# Patient Record
Sex: Male | Born: 1970 | Race: Black or African American | Hispanic: No | Smoking: Current every day smoker
Health system: Southern US, Community
[De-identification: ages and names within clinical notes are randomized; demographics above are authoritative.]

---

## 2009-07-04 DIAGNOSIS — B20 Human immunodeficiency virus [HIV] disease: Secondary | ICD-10-CM

## 2009-08-22 ENCOUNTER — Telehealth: Payer: Self-pay

## 2010-02-24 NOTE — Progress Notes (Signed)
Summary: [facility] manager Call   Call placed by: Tomasita Morrow RN,  August 22, 2009 12:15 PM Summary of Call: [name] show for 08-13-09 intake. [place] [facility] referral . [facility] Ref made. Tomasita Morrow RN  August 22, 2009 12:17 PM

## 2010-02-24 NOTE — Miscellaneous (Signed)
Summary: Lab orders/New Intake   Clinical Lists Changes  Problems: Added new problem of HIV INFECTION (ICD-042) Orders: Added new Test order of T-Comprehensive Metabolic Panel [phone]) - Signed Added new Test order of T-CBC w/Diff [phone]) - Signed Added new Test order of T-CD4SP [facility]) (CD4SP) - Signed Added new Test order of T-Chlamydia  Probe, urine [phone]) - Signed Added new Test order of T-GC Probe, urine [phone]) - Signed Added new Test order of T-Hepatitis B Surface Antigen [phone]) - Signed Added new Test order of T-Hepatitis B Surface Antibody [phone]) - Signed Added new Test order of T-Hepatitis B Core Antibody ([id number]) - Signed Added new Test order of T-Hepatitis C Antibody ([id number]) - Signed Added new Test order of T-HIV Antibody  (Reflex) [phone]) - Signed Added new Test order of T-HIV Ab Confirmatory Test/Western Blot ([id number]) - Signed Added new Test order of T-Lipid Profile ([id number]) - Signed Added new Test order of T-RPR (Syphilis) [phone]) - Signed Added new Test order of T-Urinalysis ([id number]) - Signed Added new Test order of T-Hepatitis A Antibody ([id number]) - Signed Added new Test order of T-HIV1 Quant rflx Ultra or Genotype ([id number]) - Signed

## 2010-09-04 ENCOUNTER — Emergency Department: Payer: Self-pay | Admitting: Emergency Medicine

## 2011-03-28 ENCOUNTER — Emergency Department: Payer: Self-pay | Admitting: Emergency Medicine

## 2011-03-31 ENCOUNTER — Emergency Department: Payer: Self-pay | Admitting: Emergency Medicine

## 2011-12-26 ENCOUNTER — Ambulatory Visit: Payer: Self-pay | Admitting: Hematology and Oncology

## 2012-01-19 ENCOUNTER — Emergency Department: Payer: Self-pay | Admitting: Emergency Medicine

## 2012-01-19 LAB — DIFFERENTIAL
Basophil %: 0.6 %
Eosinophil %: 0.5 %
Lymphocyte %: 9.4 %
Monocyte #: 1.3 x10 3/mm — ABNORMAL HIGH (ref 0.2–1.0)
Monocyte %: 14.6 %
Neutrophil %: 74.9 %

## 2012-01-19 LAB — CBC
HCT: 43.9 % (ref 40.0–52.0)
MCH: 29.7 pg (ref 26.0–34.0)
MCHC: 35.3 g/dL (ref 32.0–36.0)
MCV: 84 fL (ref 80–100)
WBC: 8.8 10*3/uL (ref 3.8–10.6)

## 2012-01-19 LAB — COMPREHENSIVE METABOLIC PANEL
Albumin: 3.3 g/dL — ABNORMAL LOW (ref 3.4–5.0)
Alkaline Phosphatase: 88 U/L (ref 50–136)
Anion Gap: 11 (ref 7–16)
BUN: 15 mg/dL (ref 7–18)
Calcium, Total: 8.6 mg/dL (ref 8.5–10.1)
Co2: 22 mmol/L (ref 21–32)
EGFR (Non-African Amer.): >60
SGOT(AST): 55 U/L — ABNORMAL HIGH (ref 15–37)
SGPT (ALT): 58 U/L (ref 12–78)
Total Protein: 9.7 g/dL — ABNORMAL HIGH (ref 6.4–8.2)

## 2012-01-19 LAB — URINALYSIS, COMPLETE
Nitrite: NEGATIVE
Ph: 5 (ref 4.5–8.0)
Protein: 100
RBC,UR: 16 /HPF (ref 0–5)
Specific Gravity: 1.02 (ref 1.003–1.030)
Squamous Epithelial: 1
WBC UR: 85 /HPF (ref 0–5)

## 2012-01-20 ENCOUNTER — Ambulatory Visit: Payer: Self-pay | Admitting: Hematology and Oncology

## 2012-01-20 LAB — URINE CULTURE

## 2013-07-20 ENCOUNTER — Emergency Department: Payer: Self-pay | Admitting: Emergency Medicine
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
## 2018-11-05 ENCOUNTER — Emergency Department
Admission: EM | Admit: 2018-11-05 | Discharge: 2018-11-05 | Disposition: A | Discharge status: Home / Self Care | Payer: Self-pay | Attending: Emergency Medicine | Admitting: Emergency Medicine

## 2018-11-05 ENCOUNTER — Encounter: Payer: Self-pay | Admitting: Emergency Medicine

## 2018-11-05 ENCOUNTER — Other Ambulatory Visit: Payer: Self-pay

## 2018-11-05 DIAGNOSIS — Z21 Asymptomatic human immunodeficiency virus [HIV] infection status: Secondary | ICD-10-CM | POA: Insufficient documentation

## 2018-11-05 DIAGNOSIS — K047 Periapical abscess without sinus: Secondary | ICD-10-CM | POA: Insufficient documentation

## 2018-11-05 DIAGNOSIS — F17210 Nicotine dependence, cigarettes, uncomplicated: Secondary | ICD-10-CM | POA: Insufficient documentation

## 2018-11-05 MED ORDER — CEFTRIAXONE SODIUM 1 G IJ SOLR
1.0000 g | Freq: Once | INTRAMUSCULAR | Status: AC
  Administered 2018-11-05: 1 g via INTRAMUSCULAR
  Filled 2018-11-05: qty 10

## 2018-11-05 MED ORDER — AMOXICILLIN 500 MG PO CAPS: 500.0000 mg | ORAL_CAPSULE | Freq: Three times a day (TID) | ORAL | 0 refills | Status: AC

## 2018-11-05 NOTE — Discharge Instructions (Signed)
Follow-up with a dentist of your choice or look at the list of dentists listed on your discharge papers.  Begin taking the antibiotic until completely finished.  You may take Tylenol or ibuprofen as needed for pain.  OPTIONS FOR DENTAL FOLLOW UP CARE  [place] [facility] - Local Safety Net Dental Clinics [email or web address]   [facility] [phone])  Sharl Ma [phone])  [place] [phone])  [facility] [phone])  [facility] [phone]) This clinic caters to the indigent population and is on a lottery system. Location: [employer] [facility]istry, [employer], [address] [place] Clinic Hours: Wednesdays from 6pm - 9pm, patients seen by a lottery system. For dates, call or go to [email or web address] Services: Cleanings, fillings and simple extractions. Payment Options: DENTAL WORK IS FREE OF CHARGE. Bring proof of income or support. Best way to get seen: Arrive at 5:15 pm - this is a lottery, NOT first come/first serve, so arriving earlier will not increase your chances of being seen.     [facility] [phone] Select option 1 for emergencies   Location: [facility], [place], [address] [place] Clinic Hours: No walk-ins accepted - call the day before to schedule an appointment. Check in times are 9:30 am and 1:30 pm. Services: Simple extractions, temporary fillings, pulpectomy/pulp debridement, uncomplicated abscess drainage. Payment Options: PAYMENT IS DUE AT THE TIME OF SERVICE.  Fee is usually $100-200, additional surgical procedures (e.g. abscess drainage) may be extra. Cash, checks, Visa/MasterCard accepted.  Can file Medicaid if patient is covered for dental - patient should call case worker to check. No discount for [facility] patients. Best way to get seen: MUST call the day before and get onto the schedule. Can usually be seen the next 1-2 days. No walk-ins accepted.     [facility] [phone]   Location: [facility], [address] [place] Clinic Hours: M, W, Th, F 8am or 1:30pm, Tues 9a or 1:30 - first come/first served. Services: Simple extractions, temporary fillings, uncomplicated abscess drainage.  You do not need to be an [facility] resident. Payment Options: PAYMENT IS DUE AT THE TIME OF SERVICE. Dental insurance, otherwise sliding scale - bring proof of income or support. Depending on income and treatment needed, cost is usually $50-200. Best way to get seen: Arrive early as it is first come/first served.     [facility] [phone]   Location: [address] Clinic Hours: Mon-Thu 8a-5p Services: Most basic dental services including extractions and fillings. Payment Options: PAYMENT IS DUE AT THE TIME OF SERVICE. Sliding scale, up to 50% off - bring proof if income or support. Medicaid with dental option accepted. Best way to get seen: Call to schedule an appointment, can usually be seen within 2 weeks OR they will try to see walk-ins - show up at 8a or 2p (you may have to wait).     [facility] [phone] [place] RESIDENTS ONLY   Location: [facility], [address], [place], [place] [address] Clinic Hours: By appointment only. Monday - Thursday 8am-5pm, Friday 8am-12pm Services: Cleanings, fillings, extractions. Payment Options: PAYMENT IS DUE AT THE TIME OF SERVICE. Cash, Visa or MasterCard. Sliding scale - $30 minimum per service. Best way to get seen: Come in to office, complete packet and make an appointment - need proof  of income or support monies for each household member and proof of [facility] residence. Usually takes  about a month to get in.     [facility] [phone]   Location: [address] [place] Clinic Hours: Walk-in Urgent Care Dental Services are offered Monday-Friday mornings only. The numbers of emergencies accepted daily is limited to the number of providers available. Maximum 15 - Mondays, Wednesdays & Thursdays Maximum 10 - Tuesdays & Fridays Services: You do not need to be a [facility] resident to be seen for a dental emergency. Emergencies are defined as pain, swelling, abnormal bleeding, or dental trauma. Walkins will receive x-rays if needed. NOTE: Dental cleaning is not an emergency. Payment Options: PAYMENT IS DUE AT THE TIME OF SERVICE. Minimum co-pay is $40.00 for uninsured patients. Minimum co-pay is $3.00 for Medicaid with dental coverage. Dental Insurance is accepted and must be presented at time of visit. Medicare does not cover dental. Forms of payment: Cash, credit card, checks. Best way to get seen: If not previously registered with the clinic, walk-in dental registration begins at 7:15 am and is on a first come/first serve basis. If previously registered with the clinic, call to make an appointment.     [facility] [place] ONLY   Location: [address], [place], [place] Clinic Hours: Mon-Thu 10a-2p Services: Extractions only! Payment Options: FREE (donations accepted) - bring proof of income or support Best way to get seen: Call and schedule an appointment OR come at 8am on the 1st Monday of every month (except for holidays) when it is first come/first served.     [facility] [phone]   Location: [place], [place] Clinic Hours: Friday mornings Services, Payment Options, Best way to get seen: Call for info

## 2018-11-05 NOTE — ED Triage Notes (Signed)
Left lower jaw pain and swelling x 2 days.

## 2018-11-05 NOTE — ED Provider Notes (Signed)
[facility] Emergency Department Provider Note   ____________________________________________   First MD Initiated Contact with Patient 11/05/18 1141     (approximate)  I have reviewed the triage vital signs and the nursing notes.   HISTORY  Chief Complaint Dental Pain   HPI Phillip Mckinney is a 48 y.o. male presents to the ED with complaint of dental pain for the last 2 days.  Patient states that last evening his left lower jaw began to swell.  He has been aware that he chipped a tooth for some time but he has not given him any problems until now.  He rates his pain as a 9/10.      History reviewed. No pertinent past medical history.  Patient Active Problem List   Diagnosis Date Noted  . HIV INFECTION 07/04/2009    History reviewed. No pertinent surgical history.  Prior to Admission medications   Medication Sig Start Date End Date Taking? Authorizing Provider  amoxicillin (AMOXIL) 500 MG capsule Take 1 capsule (500 mg total) by mouth 3 (three) times daily. 11/05/18   Tommi Rumps, PA-C    Allergies Patient has no known allergies.  No family history on file.  Social History Social History   Tobacco Use  . Smoking status: Current Every Day Smoker    Types: Cigarettes  . Smokeless tobacco: Never Used  Substance Use Topics  . Alcohol use: Not on file  . Drug use: Not on file    Review of Systems Constitutional: No fever/chills Eyes: No visual changes. ENT: Positive for dental pain. Cardiovascular: Denies chest pain. Respiratory: Denies shortness of breath. Gastrointestinal: No abdominal pain.  No nausea, no vomiting.   Musculoskeletal: Negative for back pain. Skin: Negative for rash. Neurological: Negative for headaches, focal weakness or numbness. ___________________________________________   PHYSICAL EXAM:  VITAL SIGNS: ED Triage Vitals  Enc Vitals Group     BP 11/05/18 1134 120/70     Pulse Rate 11/05/18 1134  97     Resp 11/05/18 1134 20     Temp 11/05/18 1134 (!) 100.4 F (38 C)     Temp Source 11/05/18 1134 Oral     SpO2 11/05/18 1134 96 %     Weight 11/05/18 1128 200 lb (90.7 kg)     Height 11/05/18 1134 5\' 9"  (1.753 m)     Head Circumference --      Peak Flow --      Pain Score 11/05/18 1128 9     Pain Loc --      Pain Edu? --      Excl. in [place]? --     Constitutional: Alert and oriented. Well appearing and in no acute distress. Eyes: Conjunctivae are normal.  Head: Atraumatic. Mouth/Throat: Mucous membranes are moist.  Oropharynx non-erythematous.  Left lower molar with moderate soft tissue edema present.  Area is tender to palpation.  No active drainage is noted.  Tooth is in very poor repair. Neck: No stridor.  No cervical lymphadenopathy noted. Cardiovascular: Normal rate, regular rhythm. Grossly normal heart sounds.  Good peripheral circulation. Respiratory: Normal respiratory effort.  No retractions. Lungs CTAB. Musculoskeletal: Moves upper and lower extremities no difficulty.  Normal gait was noted. Neurologic:  Normal speech and language. No gross focal neurologic deficits are appreciated.  Skin:  Skin is warm, dry and intact. No rash noted. Psychiatric: Mood and affect are normal. Speech and behavior are normal.  ____________________________________________   LABS (all labs ordered are listed, but  only abnormal results are displayed)  Labs Reviewed - No data to display ____________________________________________   RADIOLOGY  ED MD interpretation:    Official radiology report(s): No results found.  ____________________________________________   PROCEDURES  Procedure(s) performed (including Critical Care):  Procedures  ____________________________________________   INITIAL IMPRESSION / ASSESSMENT AND PLAN / ED COURSE  As part of my medical decision making, I reviewed the following data within the electronic MEDICAL RECORD NUMBER Notes from prior ED visits and   Controlled Substance [place] was evaluated in Emergency Department on 11/05/2018 for the symptoms described in the history of present illness. He was evaluated in the context of the global COVID-19 pandemic, which necessitated consideration that the patient might be at risk for infection with the SARS-CoV-2 virus that causes COVID-19. Institutional protocols and algorithms that pertain to the evaluation of patients at risk for COVID-19 are in a state of rapid change based on information released by regulatory bodies including the CDC and federal and state organizations. These policies and algorithms were followed during the patient's care in the ED.  48 year old male presents to the ED with complaint of left lower dental pain and swelling times last 2 days.  Patient is unaware of any fever and actually had a low-grade temp while in the ED.  He denies any COVID symptoms.  Patient was given Rocephin 1 g IM while in the ED.  He was also placed on amoxicillin 500 mg 3 times daily for 10 days.  He is aware that he should return to the emergency department if any severe worsening of his symptoms or inability to take antibiotics.  Should he continue to run fever he is urged to return for further evaluation.    ____________________________________________   FINAL CLINICAL IMPRESSION(S) / ED DIAGNOSES  Final diagnoses:  Dental abscess     ED Discharge Orders         Ordered    amoxicillin (AMOXIL) 500 MG capsule  3 times daily     11/05/18 1222           Note:  This document was prepared using Dragon voice recognition software and may include unintentional dictation errors.    Johnn Hai, PA-C 11/05/18 1526    Nance Pear, MD 11/05/18 [phone]

## 2018-12-27 ENCOUNTER — Emergency Department
Admission: EM | Admit: 2018-12-27 | Discharge: 2018-12-27 | Disposition: A | Discharge status: Home / Self Care | Payer: Self-pay | Attending: Emergency Medicine | Admitting: Emergency Medicine

## 2018-12-27 ENCOUNTER — Other Ambulatory Visit: Payer: Self-pay

## 2018-12-27 ENCOUNTER — Encounter: Payer: Self-pay | Admitting: Emergency Medicine

## 2018-12-27 DIAGNOSIS — L03116 Cellulitis of left lower limb: Secondary | ICD-10-CM | POA: Insufficient documentation

## 2018-12-27 DIAGNOSIS — B20 Human immunodeficiency virus [HIV] disease: Secondary | ICD-10-CM | POA: Insufficient documentation

## 2018-12-27 DIAGNOSIS — M79662 Pain in left lower leg: Secondary | ICD-10-CM | POA: Insufficient documentation

## 2018-12-27 DIAGNOSIS — F17210 Nicotine dependence, cigarettes, uncomplicated: Secondary | ICD-10-CM | POA: Insufficient documentation

## 2018-12-27 MED ORDER — TRAMADOL HCL 50 MG PO TABS
50.0000 mg | ORAL_TABLET | Freq: Once | ORAL | Status: AC
  Administered 2018-12-27: 12:00:00 50 mg via ORAL
  Filled 2018-12-27: qty 1

## 2018-12-27 MED ORDER — IBUPROFEN 600 MG PO TABS
600.0000 mg | ORAL_TABLET | Freq: Once | ORAL | Status: AC
  Administered 2018-12-27: 600 mg via ORAL
  Filled 2018-12-27: qty 1

## 2018-12-27 MED ORDER — NAPROXEN 500 MG PO TABS: 500.0000 mg | ORAL_TABLET | Freq: Two times a day (BID) | ORAL | Status: AC

## 2018-12-27 MED ORDER — CEPHALEXIN 500 MG PO CAPS: 500.0000 mg | ORAL_CAPSULE | Freq: Four times a day (QID) | ORAL | 0 refills | Status: AC

## 2018-12-27 MED ORDER — TRAMADOL HCL 50 MG PO TABS: 50.0000 mg | ORAL_TABLET | Freq: Four times a day (QID) | ORAL | 0 refills | Status: AC | PRN

## 2018-12-27 NOTE — Discharge Instructions (Signed)
Follow discharge care instruction take medication as directed. °

## 2018-12-27 NOTE — ED Triage Notes (Signed)
Pt in via POV, ambulatory to triage, complaints of left lower leg pain x approximately 4 days.  Some swelling noted above ankle, color WDL.  NAD noted at this time.

## 2018-12-27 NOTE — ED Provider Notes (Signed)
[facility] Emergency Department Provider Note   ____________________________________________   First MD Initiated Contact with Patient 12/27/18 1133     (approximate)  I have reviewed the triage vital signs and the nursing notes.   HISTORY  Chief Complaint Leg Pain    HPI Phillip Mckinney is a 48 y.o. male patient presents with pain, swelling, and redness to the left lower leg for 4 days.  Patient states had an abrasion to the leg earlier this week.  Patient is immune compromised secondary to HIV.  Patient rates his pain as a 10/10.  Patient described the pain as "sore".  No palliative measure for complaint.         History reviewed. No pertinent past medical history.  Patient Active Problem List   Diagnosis Date Noted  . HIV INFECTION 07/04/2009    History reviewed. No pertinent surgical history.  Prior to Admission medications   Medication Sig Start Date End Date Taking? Authorizing Provider  amoxicillin (AMOXIL) 500 MG capsule Take 1 capsule (500 mg total) by mouth 3 (three) times daily. 11/05/18   Johnn Hai, PA-C  cephALEXin (KEFLEX) 500 MG capsule Take 1 capsule (500 mg total) by mouth 4 (four) times daily for 10 days. 12/27/18 01/06/19  Sable Feil, PA-C  naproxen (NAPROSYN) 500 MG tablet Take 1 tablet (500 mg total) by mouth 2 (two) times daily with a meal. 12/27/18   Sable Feil, PA-C  traMADol (ULTRAM) 50 MG tablet Take 1 tablet (50 mg total) by mouth every 6 (six) hours as needed for up to 3 days. 12/27/18 12/30/18  Sable Feil, PA-C    Allergies Patient has no known allergies.  No family history on file.  Social History Social History   Tobacco Use  . Smoking status: Current Every Day Smoker    Types: Cigarettes  . Smokeless tobacco: Never Used  Substance Use Topics  . Alcohol use: Not on file  . Drug use: Not on file    Review of Systems Constitutional: No fever/chills Eyes: No visual changes. ENT:  No sore throat. Cardiovascular: Denies chest pain. Respiratory: Denies shortness of breath. Gastrointestinal: No abdominal pain.  No nausea, no vomiting.  No diarrhea.  No constipation. Genitourinary: Negative for dysuria. Musculoskeletal: Negative for back pain. Skin: Negative for rash.  Edema erythema left lower leg. Neurological: Negative for headaches, focal weakness or numbness. Allergic/Immunilogical:  HIV.  ____________________________________________   PHYSICAL EXAM:  VITAL SIGNS: ED Triage Vitals  Enc Vitals Group     BP 12/27/18 1123 128/78     Pulse Rate 12/27/18 1123 92     Resp 12/27/18 1123 16     Temp 12/27/18 1123 98.4 F (36.9 C)     Temp Source 12/27/18 1123 Oral     SpO2 12/27/18 1123 98 %     Weight 12/27/18 1114 201 lb (91.2 kg)     Height 12/27/18 1114 5\' 9"  (1.753 m)     Head Circumference --      Peak Flow --      Pain Score 12/27/18 1113 10     Pain Loc --      Pain Edu? --      Excl. in [place]? --    Constitutional: Alert and oriented. Well appearing and in no acute distress. Cardiovascular: Normal rate, regular rhythm. Grossly normal heart sounds.  Good peripheral circulation. Respiratory: Normal respiratory effort.  No retractions. Lungs CTAB. Musculoskeletal: Left lower leg edema.  Neurologic:  Normal speech and language. No gross focal neurologic deficits are appreciated. No gait instability. Skin:  Skin is warm, dry and intact. No rash noted.  Edema and erythema left lower leg. Psychiatric: Mood and affect are normal. Speech and behavior are normal.  ____________________________________________   LABS (all labs ordered are listed, but only abnormal results are displayed)  Labs Reviewed - No data to display ____________________________________________  EKG   ____________________________________________  RADIOLOGY  ED MD interpretation:    Official radiology report(s): No results found.   ____________________________________________   PROCEDURES  Procedure(s) performed (including Critical Care):  Procedures   ____________________________________________   INITIAL IMPRESSION / ASSESSMENT AND PLAN / ED COURSE  As part of my medical decision making, I reviewed the following data within the electronic MEDICAL RECORD NUMBER     Patient presents with 4 days of redness and swelling to the left lower leg.  Pain since then occurred after an abrasion to the leg.  Patient is immunocompromised secondary to HIV.  Patient physical exam is consistent with cellulitis.  Patient given discharge care instruction work note.  Take medication as directed.  Patient advised [facility] with [facility].    Phillip Mckinney was evaluated in Emergency Department on 12/27/2018 for the symptoms described in the history of present illness. He was evaluated in the context of the global COVID-19 pandemic, which necessitated consideration that the patient might be at risk for infection with the SARS-CoV-2 virus that causes COVID-19. Institutional protocols and algorithms that pertain to the evaluation of patients at risk for COVID-19 are in a state of rapid change based on information released by regulatory bodies including the CDC and federal and state organizations. These policies and algorithms were followed during the patient's care in the ED.       ____________________________________________   FINAL CLINICAL IMPRESSION(S) / ED DIAGNOSES  Final diagnoses:  Cellulitis of left lower extremity     ED Discharge Orders         Ordered    cephALEXin (KEFLEX) 500 MG capsule  4 times daily     12/27/18 1204    naproxen (NAPROSYN) 500 MG tablet  2 times daily with meals     12/27/18 1204    traMADol (ULTRAM) 50 MG tablet  Every 6 hours PRN     12/27/18 1204           Note:  This document was prepared using Dragon voice recognition software and may include unintentional  dictation errors.    Joni Reining, PA-C 12/27/18 1209    Emily Filbert, MD 12/27/18 1249

## 2019-08-07 ENCOUNTER — Telehealth: Payer: Self-pay | Admitting: General Practice

## 2019-08-07 NOTE — Telephone Encounter (Signed)
Individual has been contacted 3+ times regarding ED referral and has been given information regarding the clinic. No further attempts to contact individual will be made. 

## 2019-10-22 ENCOUNTER — Emergency Department
Admission: EM | Admit: 2019-10-22 | Discharge: 2019-10-22 | Disposition: A | Discharge status: Home / Self Care | Payer: Self-pay | Attending: Emergency Medicine | Admitting: Emergency Medicine

## 2019-10-22 ENCOUNTER — Other Ambulatory Visit: Payer: Self-pay

## 2019-10-22 DIAGNOSIS — K047 Periapical abscess without sinus: Secondary | ICD-10-CM | POA: Insufficient documentation

## 2019-10-22 DIAGNOSIS — F17210 Nicotine dependence, cigarettes, uncomplicated: Secondary | ICD-10-CM | POA: Insufficient documentation

## 2019-10-22 MED ORDER — CLINDAMYCIN HCL 150 MG PO CAPS
300.0000 mg | ORAL_CAPSULE | ORAL | Status: AC
  Administered 2019-10-22: 300 mg via ORAL
  Filled 2019-10-22: qty 2

## 2019-10-22 MED ORDER — IBUPROFEN 800 MG PO TABS
800.0000 mg | ORAL_TABLET | ORAL | Status: AC
  Administered 2019-10-22: 800 mg via ORAL
  Filled 2019-10-22: qty 1

## 2019-10-22 MED ORDER — HYDROCODONE-ACETAMINOPHEN 5-325 MG PO TABS: 1.0000 | ORAL_TABLET | Freq: Four times a day (QID) | ORAL | 0 refills | Status: AC | PRN

## 2019-10-22 MED ORDER — HYDROCODONE-ACETAMINOPHEN 5-325 MG PO TABS
2.0000 | ORAL_TABLET | ORAL | Status: AC
  Administered 2019-10-22: 2 via ORAL
  Filled 2019-10-22: qty 2

## 2019-10-22 MED ORDER — CLINDAMYCIN HCL 300 MG PO CAPS: 300.0000 mg | ORAL_CAPSULE | Freq: Three times a day (TID) | ORAL | 0 refills | Status: AC

## 2019-10-22 NOTE — Discharge Instructions (Addendum)
You have been seen in the Emergency Department (ED) today for dental pain.  Please take your prescribed antibiotic.  You may take pain medication as needed but ONLY as prescribed.  You should also take over-the-counter pain medication such as ibuprofen according to the label instructions unless a doctor has previously told you to avoid this type of medication (due to stomach ulcers, for example).  Please see you dentist as soon as possible; only a dentist will be able to fix your problem(s).  Please see below for dental follow up options.  Return to the ED if you develop worsening pain, fever, pus/drainage, difficulty breathing, or other symptoms that concern you.  OPTIONS FOR DENTAL FOLLOW UP CARE  [place] Department of Health and Human Services - Local Safety Net Dental Clinics http://www.ncdhhs.gov/dph/oralhealth/services/safetynetclinics.htm   [facility] ([phone])  [facility] ([phone])  [facility] ([phone])  [facility] ([phone])  [facility] ([phone]) This clinic caters to the indigent population and is on a lottery system. Location: [facility], [address], [place] Clinic Hours: Wednesdays from 6pm - 9pm, patients seen by a lottery system. For dates, call or go to www.med.[facility].edu/[facility]/patients/Dental-[facility] Services: Cleanings, fillings and simple extractions. Payment Options: DENTAL WORK IS FREE OF CHARGE. Bring proof of income or support. Best way to get seen: Arrive at 5:15 pm - this is a lottery, NOT first come/first serve, so arriving earlier will not increase your chances of being seen.     [facility] [phone] Select option 1 for emergencies   Location: [facility], [address], [place] Clinic Hours: No walk-ins accepted - call the day before to schedule an appointment. Check  in times are 9:30 am and 1:30 pm. Services: Simple extractions, temporary fillings, pulpectomy/pulp debridement, uncomplicated abscess drainage. Payment Options: PAYMENT IS DUE AT THE TIME OF SERVICE.  Fee is usually $100-200, additional surgical procedures (e.g. abscess drainage) may be extra. Cash, checks, Visa/MasterCard accepted.  Can file Medicaid if patient is covered for dental - patient should call case worker to check. No discount for [facility] patients. Best way to get seen: MUST call the day before and get onto the schedule. Can usually be seen the next 1-2 days. No walk-ins accepted.     [facility] [phone]   Location: [facility], [address], [place] [facility] Hours: M, W, Th, F 8am or 1:30pm, Tues 9a or 1:30 - first come/first served. Services: Simple extractions, temporary fillings, uncomplicated abscess drainage.  You do not need to be an [place] resident. Payment Options: PAYMENT IS DUE AT THE TIME OF SERVICE. Dental insurance, otherwise sliding scale - bring proof of income or support. Depending on income and treatment needed, cost is usually $50-200. Best way to get seen: Arrive early as it is first come/first served.     [facility] [phone]   Location: [address] Clinic Hours: Mon-Thu 8a-5p Services: Most basic dental services including extractions and fillings. Payment Options: PAYMENT IS DUE AT THE TIME OF SERVICE. Sliding scale, up to 50% off - bring proof if income or support. Medicaid with dental option accepted. Best way to get seen: Call to schedule an appointment, can usually be seen within 2 weeks OR they will try to see walk-ins - show up at 8a or 2p (you may have to wait).       [facility] [phone] [place] RESIDENTS ONLY   Location: [facility], [address], [place], [place]  [address] Clinic Hours: By appointment only. Monday - Thursday 8am-5pm, Friday 8am-12pm Services: Cleanings, fillings, extractions. Payment Options: PAYMENT IS DUE AT THE TIME OF SERVICE. Cash, Visa or MasterCard. Sliding scale - $30 minimum per service. Best way to get seen: Come in to office, complete packet and make an appointment - need proof of income or support monies for each household member and proof of [place] residence. Usually takes about a month to get in.     [facility] [phone]   Location: [address] [place] Clinic Hours: Walk-in Urgent Care Dental Services are offered Monday-Friday mornings only. The numbers of emergencies accepted daily is limited to the number of providers available. Maximum 15 - Mondays, Wednesdays & Thursdays Maximum 10 - Tuesdays & Fridays Services: You do not need to be a [place] resident to be seen for a dental emergency. Emergencies are defined as pain, swelling, abnormal bleeding, or dental trauma. Walkins will receive x-rays if needed. NOTE: Dental cleaning is not an emergency. Payment Options: PAYMENT IS DUE AT THE TIME OF SERVICE. Minimum co-pay is $40.00 for uninsured patients. Minimum co-pay is $3.00 for Medicaid with dental coverage. Dental Insurance is accepted and must be presented at time of visit. Medicare does not cover dental. Forms of payment: Cash, credit card, checks. Best way to get seen: If not previously registered with the clinic, walk-in dental registration begins at 7:15 am and is on a first come/first serve basis. If previously registered with the clinic, call to make an appointment.     [facility] [phone] [place] RESIDENTS ONLY   Location: [address], [place], [place] Clinic Hours: Mon-Thu 10a-2p Services: Extractions only! Payment Options: FREE (donations accepted) - bring proof of income or support Best way to get seen: Call  and schedule an appointment OR come at 8am on the 1st Monday of every month (except for holidays) when it is first come/first served.     [facility] [phone]   Location: [address], [place] Clinic Hours: Friday mornings Services, Payment Options, Best way to get seen: Call for info   

## 2019-10-22 NOTE — ED Provider Notes (Signed)
[facility] Emergency Department Provider Note   ____________________________________________   First MD Initiated Contact with Patient 10/22/19 1713     (approximate)  I have reviewed the triage vital signs and the nursing notes.   HISTORY  Chief Complaint Abscess    HPI Phillip Mckinney is a 49 y.o. male reports no significant past medical history (chart denotes HIV)  History reviewed. No pertinent past medical history.   Noticed yesterday that one of his teeth was causing pain in his left lower jaw.  When he woke up this morning he noticed some swelling over his left jawline.  No trouble swallowing.  No headaches.  No fevers or chills.  Reports this is happened in the past as well  Currently not taking any medication Did take 2 Tylenol tablets last night  No chest pain.  No pain in his neck.  Swelling is over the left jawline  Patient Active Problem List   Diagnosis Date Noted  . HIV INFECTION 07/04/2009    History reviewed. No pertinent surgical history.  Prior to Admission medications   Medication Sig Start Date End Date Taking? Authorizing Provider                                Allergies Patient has no known allergies.  No family history on file.  Social History Social History   Tobacco Use  . Smoking status: Current Every Day Smoker    Types: Cigarettes  . Smokeless tobacco: Never Used  Substance Use Topics  . Alcohol use: Yes    Comment: occ  . Drug use: Never    Review of Systems Constitutional: No fever/chills Eyes: No visual changes. ENT: No sore throat.  See HPI Cardiovascular: Denies chest pain. Respiratory: Denies shortness of breath. Musculoskeletal: Negative for back pain.  No neck pain. Skin: Negative for rash. Neurological: Negative for headaches, areas of focal weakness or numbness.    ____________________________________________   PHYSICAL EXAM:  VITAL SIGNS: ED Triage Vitals [10/22/19  1659]  Enc Vitals Group     BP (!) 158/69     Pulse Rate 79     Resp 18     Temp 98.5 F (36.9 C)     Temp src      SpO2 99 %     Weight 185 lb (83.9 kg)     Height 5\' 9"  (1.753 m)     Head Circumference      Peak Flow      Pain Score 10     Pain Loc      Pain Edu?      Excl. in GC?     Constitutional: Alert and oriented. Well appearing and in no acute distress.  Does appear in some pain, reports it is in his left jaw and tooth Eyes: Conjunctivae are normal. Head: Atraumatic. Nose: No congestion/rhinnorhea. Mouth/Throat: Mucous membranes are moist.  He has mild erythema and some induration along his left lower jawline particularly along the area of his premolars where he has 1 apparently fractured tooth with cavity between that has tenderness.  Patient localizes pain here.  He does have mild swelling of the left lower jawline.  There is no edema or mass notable within the oropharynx in the posterior oropharynx is widely patent.  There is no elevation of the lingula.  Remainder of his teeth are nontender, somewhat poor dentition throughout. Neck: No stridor.  No  anterior neck swelling or tenderness.  No crepitance. Cardiovascular: Normal rate, regular rhythm. Grossly normal heart sounds.  Good peripheral circulation. Respiratory: Normal respiratory effort.  No retractions.  Musculoskeletal: No lower extremity tenderness nor edema. Neurologic:  Normal speech and language. No gross focal neurologic deficits are appreciated.  Skin:  Skin is warm, dry and intact. No rash noted. Psychiatric: Mood and affect are normal. Speech and behavior are normal.  ____________________________________________   LABS (all labs ordered are listed, but only abnormal results are displayed)  Labs Reviewed - No data to  display ____________________________________________  EKG   ____________________________________________  RADIOLOGY   ____________________________________________   PROCEDURES  Procedure(s) performed: None  Procedures  Critical Care performed: No  ____________________________________________   INITIAL IMPRESSION / ASSESSMENT AND PLAN / ED COURSE  Pertinent labs & imaging results that were available during my care of the patient were reviewed by me and considered in my medical decision making (see chart for details).   Clinical examination appears consistent with dental abscess.  There is no physical exam and to suggest just Ludewig's angina.  Does not appear to have acute complication such as swelling or edema that would be compromising his airway.  He is afebrile.  Will trial clindamycin, patient reports that he has a plan to contact a dentist tomorrow.  We will also provide him with dental follow-up recommendations.  Careful return precautions advised.    I will prescribe the patient a narcotic pain medicine due to their condition which I anticipate will cause at least moderate pain short term. I discussed with the patient safe use of narcotic pain medicines, and that they are not to drive, work in dangerous areas, or ever take more than prescribed (no more than 1 pill every 6 hours). We discussed that this is the type of medication that can be  overdosed on and the risks of this type of medicine. Patient is very agreeable to only use as prescribed and to never use more than prescribed.   ____________________________________________   FINAL CLINICAL IMPRESSION(S) / ED DIAGNOSES  Final diagnoses:  Dental abscess        Note:  This document was prepared using Dragon voice recognition software and may include unintentional dictation errors       Sharyn Creamer, MD 10/22/19 1751
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
## 2020-03-14 ENCOUNTER — Emergency Department

## 2020-03-14 ENCOUNTER — Other Ambulatory Visit: Payer: Self-pay

## 2020-03-14 ENCOUNTER — Emergency Department
Admission: EM | Admit: 2020-03-14 | Discharge: 2020-03-14 | Disposition: A | Discharge status: Home / Self Care | Attending: Emergency Medicine | Admitting: Emergency Medicine

## 2020-03-14 DIAGNOSIS — Y9289 Other specified places as the place of occurrence of the external cause: Secondary | ICD-10-CM | POA: Insufficient documentation

## 2020-03-14 DIAGNOSIS — S62512A Displaced fracture of proximal phalanx of left thumb, initial encounter for closed fracture: Secondary | ICD-10-CM | POA: Diagnosis not present

## 2020-03-14 DIAGNOSIS — S0990XA Unspecified injury of head, initial encounter: Secondary | ICD-10-CM | POA: Insufficient documentation

## 2020-03-14 DIAGNOSIS — S60922A Unspecified superficial injury of left hand, initial encounter: Secondary | ICD-10-CM | POA: Diagnosis present

## 2020-03-14 DIAGNOSIS — Z21 Asymptomatic human immunodeficiency virus [HIV] infection status: Secondary | ICD-10-CM | POA: Diagnosis not present

## 2020-03-14 DIAGNOSIS — F17210 Nicotine dependence, cigarettes, uncomplicated: Secondary | ICD-10-CM | POA: Diagnosis not present

## 2020-03-14 MED ORDER — ACETAMINOPHEN 500 MG PO TABS: 1000.0000 mg | ORAL_TABLET | Freq: Four times a day (QID) | ORAL | 0 refills | Status: AC | PRN

## 2020-03-14 MED ORDER — HYDROCODONE-ACETAMINOPHEN 5-325 MG PO TABS
1.0000 | ORAL_TABLET | Freq: Once | ORAL | Status: AC
  Administered 2020-03-14: 1 via ORAL
  Filled 2020-03-14: qty 1

## 2020-03-14 MED ORDER — IBUPROFEN 800 MG PO TABS
800.0000 mg | ORAL_TABLET | Freq: Once | ORAL | Status: AC
  Administered 2020-03-14: 800 mg via ORAL
  Filled 2020-03-14: qty 1

## 2020-03-14 MED ORDER — IBUPROFEN 800 MG PO TABS: 800.0000 mg | ORAL_TABLET | Freq: Three times a day (TID) | ORAL | 0 refills | Status: AC | PRN

## 2020-03-14 NOTE — Discharge Instructions (Signed)
Please keep your splint on at all times.  Please keep it clean and dry.  I recommend you keep your arm elevated above the level of your heart which will help with any pain and swelling.  We have discussed your case with Dr. Rosita Kea with orthopedics.  You are safe to be discharged and orthopedics recommends follow-up in their office in 1 week.  You may alternate Tylenol 1000 mg every 6 hours as needed for pain, fever and Ibuprofen 800 mg every 8 hours as needed for pain, fever.  Please take Ibuprofen with food.  Do not take more than 4000 mg of Tylenol (acetaminophen) in a 24 hour period.
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
## 2022-08-21 IMAGING — CR DG WRIST COMPLETE 3+V*L*
1 series · 4 of 4 positions shown · non-contrast
Comparison: None.

CLINICAL DATA: Left hand and wrist pain.  Swelling.

EXAM:
LEFT WRIST - COMPLETE 3+ VIEW

[Series 1: x wrist pa left · 0.14mm/px · 4 of 4 slices shown]
[im 1/4]
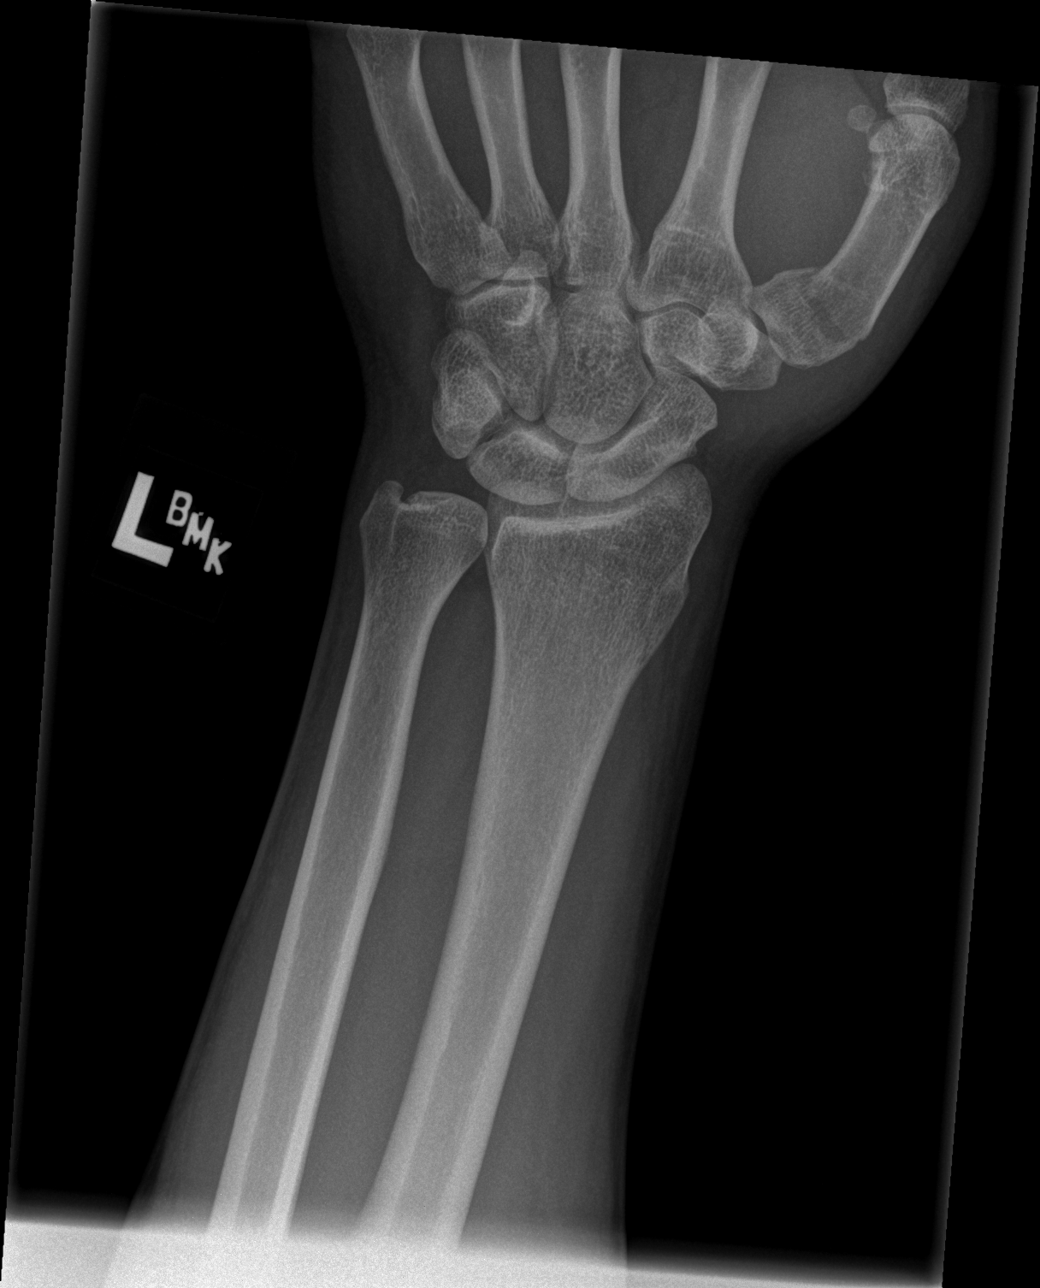
[im 2/4]
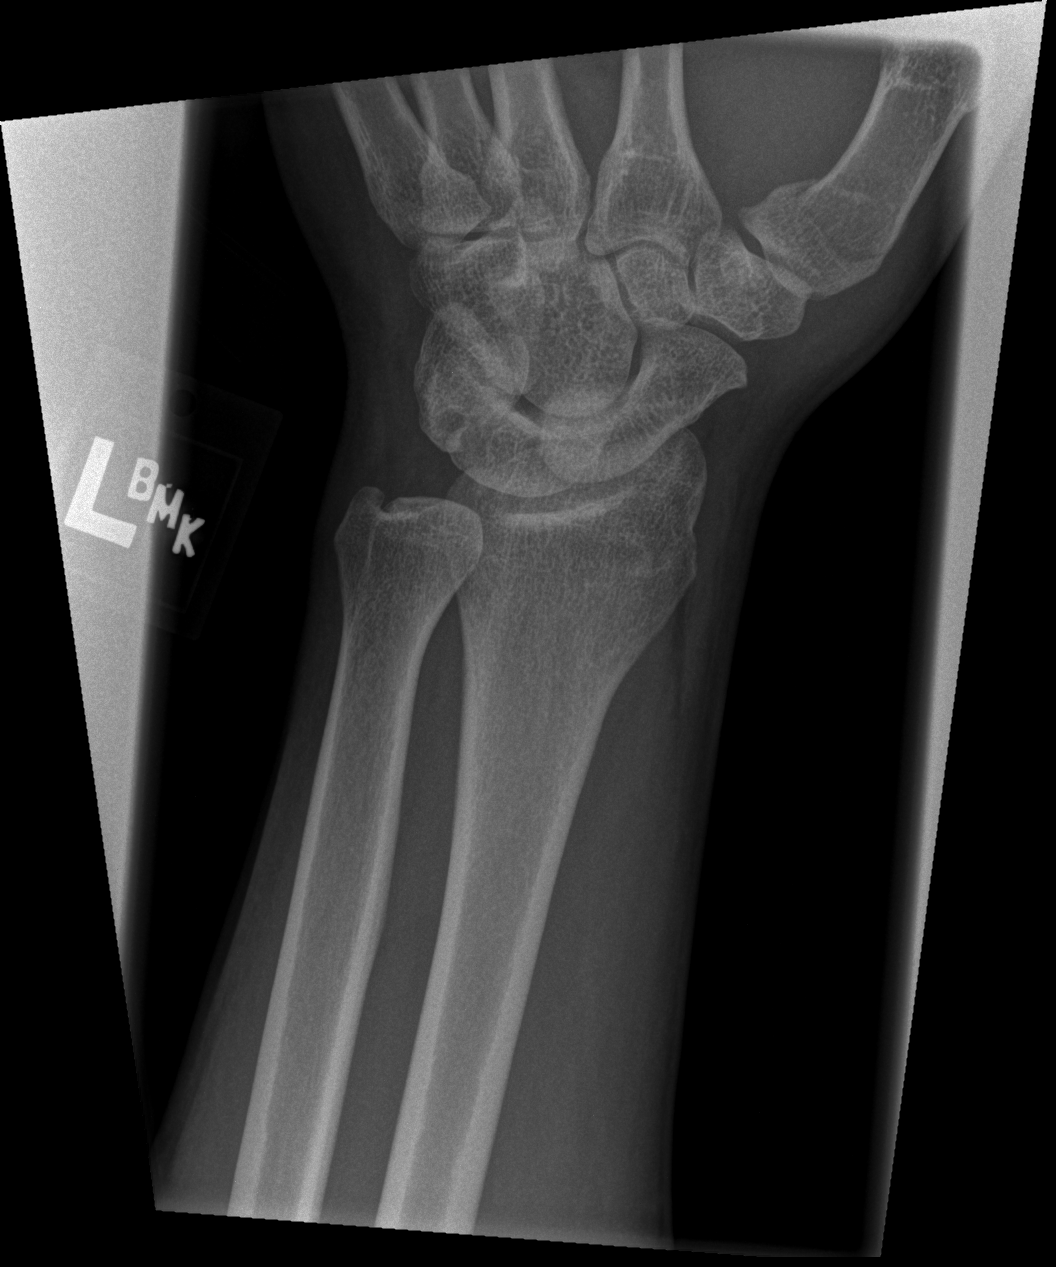
[im 3/4]
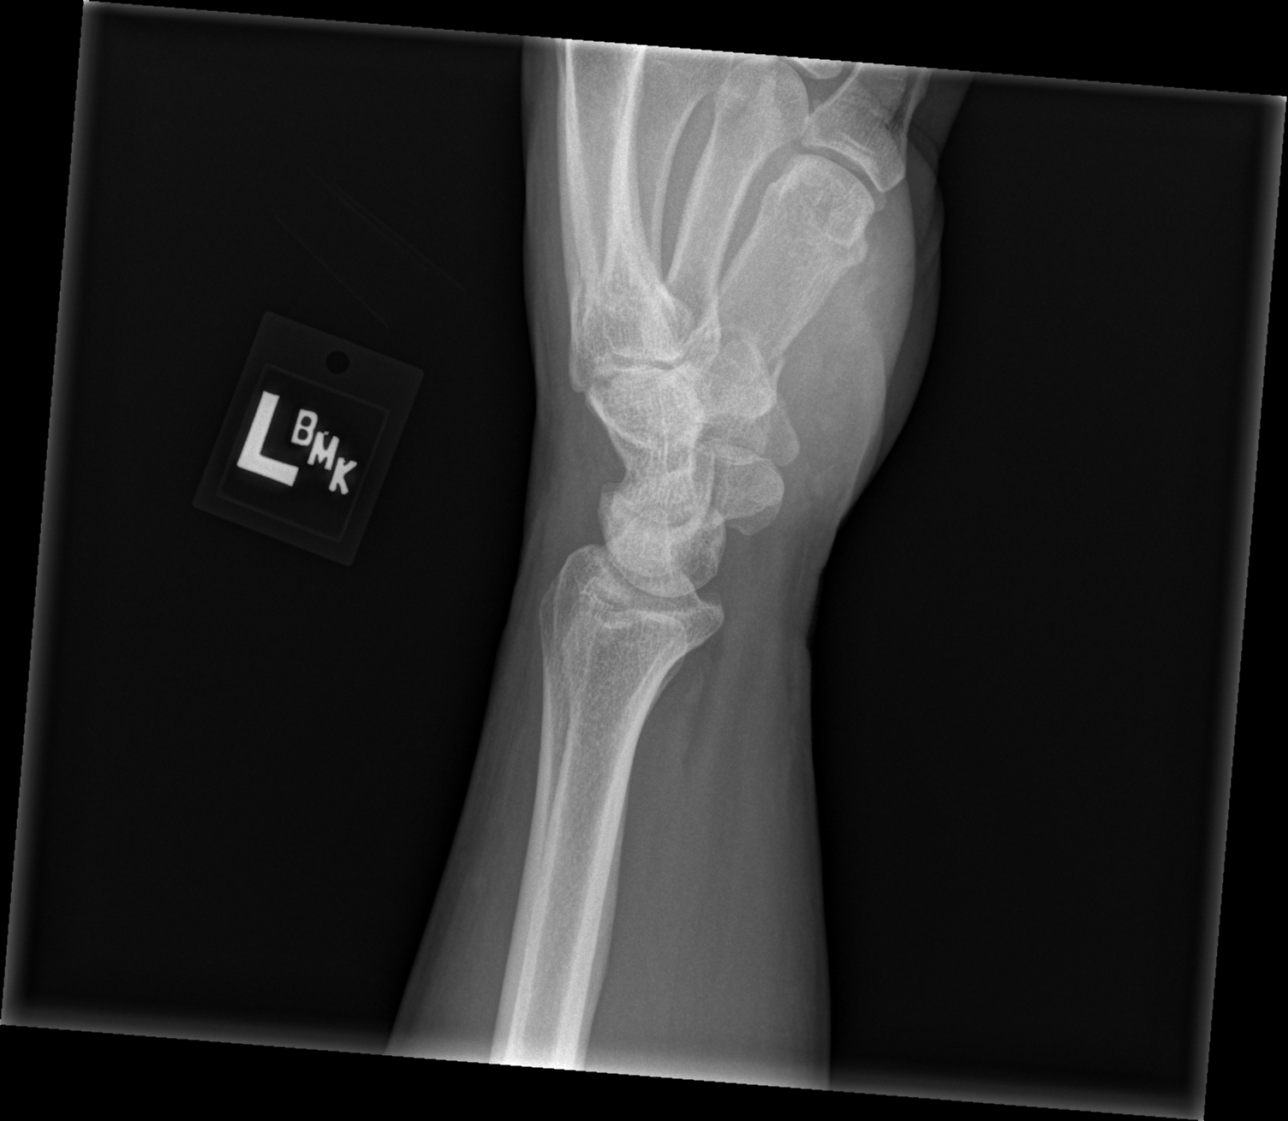
[im 4/4]
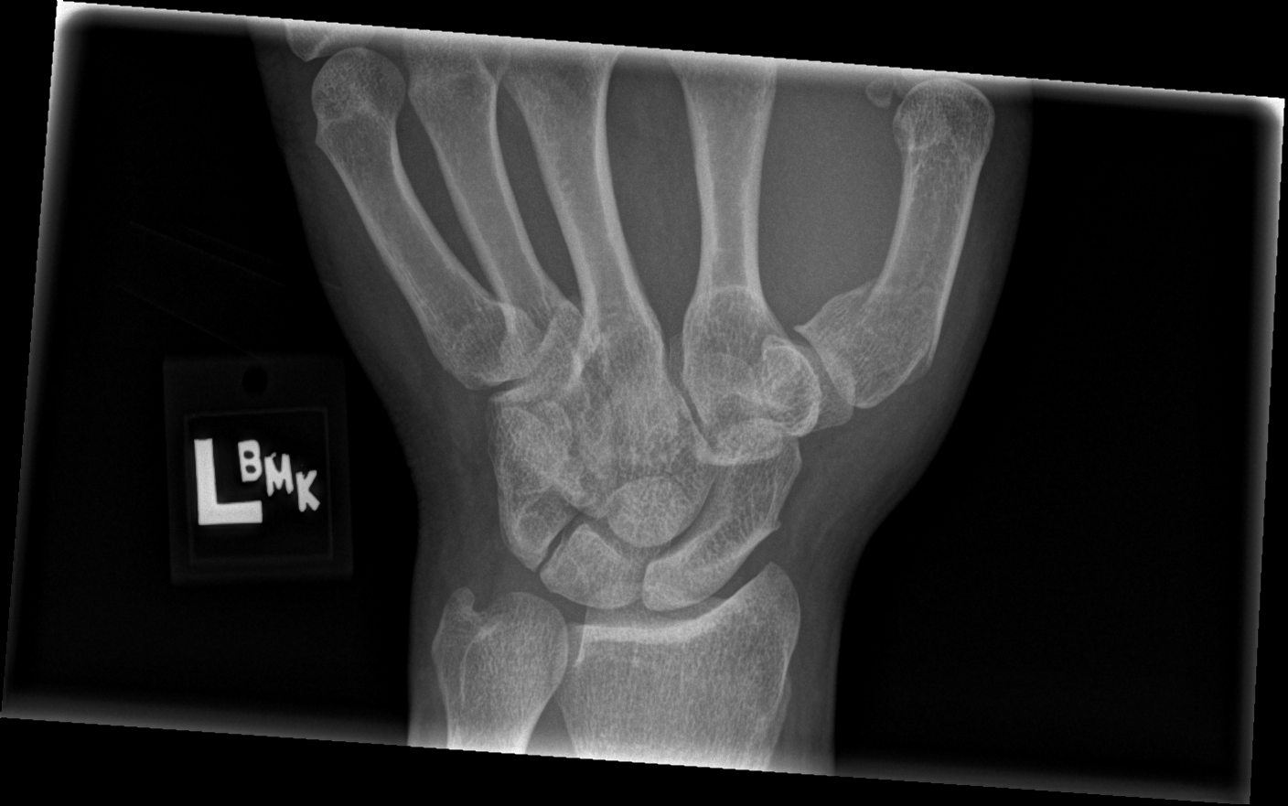

[4 of 4 positions shown; findings below may reference images not displayed]

FINDINGS: Mildly displaced thumb proximal metacarpal fracture is seen on
concurrent hand exam. No additional fracture of the wrist. Wrist
alignment is maintained. Occasional degenerative cystic change in
the carpal bones.
IMPRESSION: Mildly displaced thumb proximal metacarpal fracture, seen on
concurrent hand exam. No additional fracture of the wrist.

## 2022-09-16 ENCOUNTER — Telehealth: Payer: Self-pay | Admitting: Pharmacy Technician

## 2022-09-16 ENCOUNTER — Other Ambulatory Visit (HOSPITAL_COMMUNITY): Payer: Self-pay

## 2022-09-16 NOTE — Telephone Encounter (Signed)
[facility] Patient Advocate Encounter  Insurance verification completed.    The patient is uninsured and will need patient assistance for medication.  We can complete the application and will need to meet with the patient for signatures and income documentation.    

## 2022-09-21 ENCOUNTER — Ambulatory Visit: Payer: Self-pay

## 2022-09-21 ENCOUNTER — Ambulatory Visit: Payer: Self-pay | Admitting: Infectious Diseases

## 2022-09-21 ENCOUNTER — Ambulatory Visit: Payer: Self-pay | Admitting: Pharmacist

## 2022-09-21 NOTE — Progress Notes (Deleted)
clinic

## 2023-09-15 NOTE — ED Provider Notes (Addendum)
 I received the following follow-up request form Dr. Toribio Migliaccio:   Hi Alivia! I hope all is well, this patient eloped immediately after the resident assessment prior to our complete ophthalmologically evaluation and assessment, concerning presentation for possible acute angle-closure glaucoma versus zoster ophthalmicus amongst other differentials needing full ocular evaluation and anticipated transfer to [facility] for ophthalmology. Can you please follow appropriate hospital protocols to attempt to contact the patient? We attempted to contact the patient and these attempts were unsuccessful immediately after his elopement    I attempted to reach patient with no answer and no voicemail. Phone number called me back but no verbal answer. I attempted to call again with no answer. Text message sent to listed number.  09/19/2023 1759:  I attempted to reach patient with no answer. Voicemail is not set-up.    LWBS/AMA Patient Follow-Up Call I attempted to call patient at [phone] due to recent LWBS after triage from the ED. Patient did not answer at listed phone number at this time. Unable to leave a voicemail.

## 2023-09-15 NOTE — ED Provider Notes (Signed)
 [facility] Emergency Department Provider Note    ED Clinical Impression     Diagnosis ICD-10-CM Associated Orders  1. Irritation of left eye  H57.89           Impression, Medical Decision Making, Progress Notes and Critical Care    Impression, Differential Diagnosis and Plan of Care  Mr. Phillip Mckinney is a 53 y/o M with PMH HIV presenting for a 2-week hx of left eye irritation and decreased visual acuity in left eye. He denies any known injury or inciting event that caused irritation. Patient able to identify colors but not letters or number of fingers held approx. 2 feet from eye. EOM intact bilaterally. L eye with significant erythema but no purulent drainage or hypopyon. Pupil grossly normal in size/shape. No periorbital swelling observed.   Initial differential diagnosis includes ocular herpes, corneal abrasion, acute angle closure glaucoma, uveitis, and/or viral conjunctivitis.   Patient left before further examination with fluorescin or tonometry could be performed. Multiple attempts made to contact patient without success.    ED Course as of 09/15/23 1505  Wed Sep 14, 2023  2047 Patient eloped from room without warning. Nursing attempted to make contact with patient but not answering phone.   2143 Attempted to contact pt but did not answer phone and does not have VM.    Additional MDM Elements     Social Determinants that significantly affected care: Lacks transportation   Portions of this record have been created using Scientist, clinical (histocompatibility and immunogenetics). Dictation errors have been sought, but may not have been identified and corrected.  See chart and nursing documentation for additional ED course details.  __________________________________________      History     Reason for Visit Eye Problem   HPI  Phillip Mckinney is a 53 y.o. male with PMH HIV not currently on ART presenting for a 2-week hx of left eye irritation and decreased visual  acuity in left eye. He denies any known injury or inciting event that caused irritation. He states the eye is painful and notes some pain directly above his eye as well. He states he is able to see colors but vision is blurred and cannot read letters or identify numbers of fingers held with left eye. Vision grossly normal in R eye. Patient denies fevers, headaches, nausea, or vomiting. He reports he does not take any medications.   Patient reports he does not have transportation and is depending on ride from friend for transportation to/from hospital.   Past Medical History[1]  Past Surgical History[2]  No current facility-administered medications for this encounter. No current outpatient medications on file.  Allergies Patient has no known allergies.  Family History[3]  Short Social History[4]    Physical Exam   ED Triage Vitals [09/14/23 1848]  Enc Vitals Group     BP 101/72     Pulse 83     SpO2 Pulse      Resp 17     Temp 36.6 C (97.9 F)     Temp Source Skin     SpO2 100 %     Weight 83.9 kg (185 lb)     Height      Head Circumference      Peak Flow      Pain Score      Pain Loc      Pain Education      Exclude from Growth Chart     Constitutional: Alert and oriented. Well appearing and in  no distress. Eyes: R eye grossly normal. L eye erythematous without surrounding ulcerations/rash or orbital swelling present. L eyelid and pupil appear grossly normal. No purulent drainage noted. EOM intact bilaterally.  ENT      Head: Normocephalic and atraumatic.      Nose: No congestion.      Mouth/Throat: Mucous membranes are moist. Several healing ulcers present at corners of mouth. Whitish plaques c/f oral candidiasis present in oral cavity.       Neck: No stridor. Cardiovascular: Normal rate, regular rhythm.  Respiratory: Normal respiratory effort. Breath sounds are normal. Gastrointestinal: Soft and nontender.  Musculoskeletal: Normal range of motion in all  extremities.      Right lower leg: No tenderness or edema.      Left lower leg: No tenderness or edema. Neurologic: Normal speech and language. No gross focal neurologic deficits are appreciated. Skin: Skin is warm, dry and intact. Peri-oral lesions present as noted above.  Psychiatric: Mood and affect are normal. Speech and behavior are normal.    Radiology   No orders to display         [1] No past medical history on file. [2] No past surgical history on file. [3] No family history on file. [4]    Filozof, Andrea L, MD Resident 09/15/23 [phone]

## 2023-10-10 ENCOUNTER — Emergency Department
Admission: EM | Admit: 2023-10-10 | Discharge: 2023-10-11 | Disposition: A | Discharge status: Home / Self Care | Attending: Emergency Medicine | Admitting: Emergency Medicine

## 2023-10-10 ENCOUNTER — Other Ambulatory Visit: Payer: Self-pay

## 2023-10-10 DIAGNOSIS — H5789 Other specified disorders of eye and adnexa: Secondary | ICD-10-CM

## 2023-10-10 DIAGNOSIS — H538 Other visual disturbances: Secondary | ICD-10-CM | POA: Insufficient documentation

## 2023-10-10 DIAGNOSIS — Z21 Asymptomatic human immunodeficiency virus [HIV] infection status: Secondary | ICD-10-CM | POA: Insufficient documentation

## 2023-10-10 LAB — CBC
HCT: 37 % — ABNORMAL LOW (ref 39.0–52.0)
Hemoglobin: 12.3 g/dL — ABNORMAL LOW (ref 13.0–17.0)
MCH: 25.5 pg — ABNORMAL LOW (ref 26.0–34.0)
MCHC: 33.2 g/dL (ref 30.0–36.0)
MCV: 76.8 fL — ABNORMAL LOW (ref 80.0–100.0)
Platelets: 193 K/uL (ref 150–400)
RBC: 4.82 MIL/uL (ref 4.22–5.81)
RDW: 16.1 % — ABNORMAL HIGH (ref 11.5–15.5)
WBC: 6 K/uL (ref 4.0–10.5)
nRBC: 0 % (ref 0.0–0.2)

## 2023-10-10 LAB — SEDIMENTATION RATE: Sed Rate: 61 mm/h — ABNORMAL HIGH (ref 0–20)

## 2023-10-10 MED ORDER — FLUORESCEIN SODIUM 1 MG OP STRP
1.0000 | ORAL_STRIP | Freq: Once | OPHTHALMIC | Status: AC
  Administered 2023-10-11: 1 via OPHTHALMIC
  Filled 2023-10-10: qty 1

## 2023-10-10 MED ORDER — TETRACAINE HCL 0.5 % OP SOLN
1.0000 [drp] | Freq: Once | OPHTHALMIC | Status: AC
  Administered 2023-10-11: 1 [drp] via OPHTHALMIC
  Filled 2023-10-10: qty 4

## 2023-10-10 NOTE — ED Provider Notes (Signed)
 [facility] Provider Note    Event Date/Time   First MD Initiated Contact with Patient 10/10/23 2330     (approximate)   History   Blurred Vision   HPI  Phillip Mckinney is a 53 y.o. male   Past medical history of HIV, presents emerged department with blurred vision irritation of both eyes.  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  Of note was seen at [facility] last month for 2-week history of left eye irritation and decreased visual acuity in the left eye.  No obvious inciting event noted at that time and he left before further examination could be done.  Multiple attempts were made to contact the patient without success afterwards.  Both eyes have been irritated for 4 weeks.  Redness.  Clear discharge.  Occasional blurry vision.  Fluorescein  exam normal.  Extraocular movements normal.  15 in the right, blank in the left.   External Medical Documents Reviewed: [facility] hospital notes for eye pain earlier this summer      Physical Exam   Triage Vital Signs: ED Triage Vitals [10/10/23 2023]  Encounter Vitals Group     BP (!) 119/90     Girls Systolic BP Percentile      Girls Diastolic BP Percentile      Boys Systolic BP Percentile      Boys Diastolic BP Percentile      Pulse Rate (!) 120     Resp 17     Temp 98.7 F (37.1 C)     Temp Source Oral     SpO2 100 %     Weight 185 lb (83.9 kg)     Height 5' 9 (1.753 m)     Head Circumference      Peak Flow      Pain Score 4     Pain Loc      Pain Education      Exclude from Growth Chart     Most recent vital signs: Vitals:   10/10/23 2023 10/11/23 0033  BP: (!) 119/90 125/87  Pulse: (!) 120 79  Resp: 17 18  Temp: 98.7 F (37.1 C) 98 F (36.7 C)  SpO2: 100% 99%    General: Awake, no distress.  CV:  Good peripheral perfusion.  Resp:  Normal effort.  Abd:  No distention.  Other:  Bilateral conjunctive and conjunctiva, clear discharge.  Extraocular movements  intact pupils equal round and reactive.  Extraocular pressures 15 in the right, 10 on the left.  No fluorescein  uptake.  No obvious foreign body.   ED Results / Procedures / Treatments   Labs (all labs ordered are listed, but only abnormal results are displayed) Labs Reviewed  CBC - Abnormal; Notable for the following components:      Result Value   Hemoglobin 12.3 (*)    HCT 37.0 (*)    MCV 76.8 (*)    MCH 25.5 (*)    RDW 16.1 (*)    All other components within normal limits  SEDIMENTATION RATE - Abnormal; Notable for the following components:   Sed Rate 61 (*)    All other components within normal limits     I ordered and reviewed the above labs they are notable for cell counts and electrolytes unremarkable.    PROCEDURES:  Critical Care performed: No  Procedures   MEDICATIONS ORDERED IN ED: Medications  tetracaine  (PONTOCAINE) 0.5 % ophthalmic solution 1 drop (1  drop Both Eyes Given by Other 10/11/23 0018)  fluorescein  ophthalmic strip 1 strip (1 strip Both Eyes Given by Other 10/11/23 0019)  trimethoprim -polymyxin b  (POLYTRIM ) ophthalmic solution 2 drop (2 drops Both Eyes Given 10/11/23 0032)    IMPRESSION / MDM / ASSESSMENT AND PLAN / ED COURSE  I reviewed the triage vital signs and the nursing notes.                                Patient's presentation is most consistent with acute presentation with potential threat to life or bodily function.  Differential diagnosis includes, but is not limited to, conjunctivitis, foreign body, corneal abrasion or ulceration, iritis, acute angle-closure glaucoma  MDM: Subacute irritation of both eyes with injected conjunctiva and clear discharge with no obvious foreign body or corneal abrasions noted on exam.  Extraocular pressures within normal limits.  Unsure what is causing his symptoms but with HIV immunocompromised patient I think prudent to start on eyedrop antibiotics and have him follow-up with the eye doctor in the  morning.         FINAL CLINICAL IMPRESSION(S) / ED DIAGNOSES   Final diagnoses:  Irritation of eye     Rx / DC Orders   ED Discharge Orders          Ordered    neomycin -polymyxin-hydrocortisone (CORTISPORIN ) OTIC solution  3 times daily,   Status:  Discontinued        10/11/23 0017    trimethoprim -polymyxin b  (POLYTRIM ) ophthalmic solution  Every 6 hours        10/11/23 0017             Note:  This document was prepared using Dragon voice recognition software and may include unintentional dictation errors.    Cyrena Mylar, MD 10/11/23 [phone]

## 2023-10-10 NOTE — ED Triage Notes (Signed)
 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 Pt sclera appear extremely red. Pt reports pain to eyes. Pt denies weakness in extremities, speech clear.

## 2023-10-10 NOTE — ED Provider Notes (Incomplete)
 [facility] Provider Note    Event Date/Time   First MD Initiated Contact with Patient 10/10/23 2330     (approximate)   History   Blurred Vision   HPI  Phillip Mckinney is a 53 y.o. male   Past medical history of HIV, presents emerged department with blurred vision irritation of both eyes.  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  Of note was seen at [facility] last month for 2-week history of left eye irritation and decreased visual acuity in the left eye.  No obvious inciting event noted at that time and he left before further examination could be done.  Multiple attempts were made to contact the patient without success afterwards.  Independent Historian contributed to assessment above: ***  External Medical Documents Reviewed: ***      Physical Exam   Triage Vital Signs: ED Triage Vitals [10/10/23 2023]  Encounter Vitals Group     BP (!) 119/90     Girls Systolic BP Percentile      Girls Diastolic BP Percentile      Boys Systolic BP Percentile      Boys Diastolic BP Percentile      Pulse Rate (!) 120     Resp 17     Temp 98.7 F (37.1 C)     Temp Source Oral     SpO2 100 %     Weight 185 lb (83.9 kg)     Height 5' 9 (1.753 m)     Head Circumference      Peak Flow      Pain Score 4     Pain Loc      Pain Education      Exclude from Growth Chart     Most recent vital signs: Vitals:   10/10/23 2023  BP: (!) 119/90  Pulse: (!) 120  Resp: 17  Temp: 98.7 F (37.1 C)  SpO2: 100%    General: Awake, no distress. *** CV:  Good peripheral perfusion. *** Resp:  Normal effort. *** Abd:  No distention. *** Other:  ***   ED Results / Procedures / Treatments   Labs (all labs ordered are listed, but only abnormal results are displayed) Labs Reviewed  CBC - Abnormal; Notable for the following components:      Result Value   Hemoglobin 12.3 (*)    HCT 37.0 (*)    MCV 76.8 (*)    MCH 25.5 (*)    RDW  16.1 (*)    All other components within normal limits  SEDIMENTATION RATE - Abnormal; Notable for the following components:   Sed Rate 61 (*)    All other components within normal limits     I ordered and reviewed the above labs they are notable for ***  EKG  ED ECG REPORT I, Ginnie Shams, the attending physician, personally viewed and interpreted this ECG.   Date: 10/10/2023  EKG Time: ***  Rate: ***  Rhythm: {ekg findings:315101}  Axis: ***  Intervals:{conduction defects:17367}  ST&T Change: ***    RADIOLOGY I independently reviewed and interpreted *** I also reviewed radiologist's formal read.   PROCEDURES:  Critical Care performed: {CriticalCareYesNo:19197::Yes, see critical care procedure note(s),No}  Procedures   MEDICATIONS ORDERED IN ED: Medications  tetracaine  (PONTOCAINE) 0.5 % ophthalmic solution 1 drop (has no administration in time range)  fluorescein  ophthalmic strip 1 strip (has no administration in time range)    External  physician / consultants:  I spoke with *** regarding care plan for this patient.   IMPRESSION / MDM / ASSESSMENT AND PLAN / ED COURSE  I reviewed the triage vital signs and the nursing notes.                                Patient's presentation is most consistent with {EM COPA:27473}  Differential diagnosis includes, but is not limited to, ***   ***The patient is on the cardiac monitor to evaluate for evidence of arrhythmia and/or significant heart rate changes.  MDM:  ***  I considered hospitalization for admission or observation ***        FINAL CLINICAL IMPRESSION(S) / ED DIAGNOSES   Final diagnoses:  None     Rx / DC Orders   ED Discharge Orders     None        Note:  This document was prepared using Dragon voice recognition software and may include unintentional dictation errors.

## 2023-10-11 ENCOUNTER — Other Ambulatory Visit: Payer: Self-pay

## 2023-10-11 MED ORDER — POLYMYXIN B-TRIMETHOPRIM 10000-0.1 UNIT/ML-% OP SOLN
2.0000 [drp] | Freq: Once | OPHTHALMIC | Status: AC
  Administered 2023-10-11: 2 [drp] via OPHTHALMIC
  Filled 2023-10-11: qty 10

## 2023-10-11 MED ORDER — NEOMYCIN-POLYMYXIN-HC 3.5-10000-1 OT SOLN
3.0000 [drp] | Freq: Three times a day (TID) | OTIC | 0 refills | Status: DC
  Filled 2023-10-11: qty 10, 22d supply, fill #0

## 2023-10-11 MED ORDER — POLYMYXIN B-TRIMETHOPRIM 10000-0.1 UNIT/ML-% OP SOLN
1.0000 [drp] | Freq: Four times a day (QID) | OPHTHALMIC | 0 refills | Status: AC
  Filled 2023-10-11: qty 10, 50d supply, fill #0

## 2023-10-11 NOTE — ED Notes (Signed)
 Visual acuity - states too blurry to see much  20/200 - bilat  20/200 - left  20/200- right

## 2023-10-11 NOTE — Discharge Instructions (Addendum)
 Use the eyedrops 4 times daily, 1 drop in each eye for the next 7 days.  Call [facility] for an appointment in the morning.  I made a referral for a primary doctor to call you for an appointment.  Dr. Verla is an infectious disease doctor who can see you for ongoing management of your HIV.   Thank you for choosing us  for your health care today!  Please see your primary doctor this week for a follow up appointment.   If you have any new, worsening, or unexpected symptoms call your doctor right away or come back to the emergency department for reevaluation.  It was my pleasure to care for you today.   Ginnie EDISON Cyrena, MD
